# Patient Record
Sex: Female | Born: 1985 | Race: Black or African American | Hispanic: No | Marital: Single | Smoking: Current every day smoker
Health system: Southern US, Community
[De-identification: ages and names within clinical notes are randomized; demographics above are authoritative.]

---

## 2008-11-04 ENCOUNTER — Emergency Department (HOSPITAL_COMMUNITY): Admission: EM | Admit: 2008-11-04 | Discharge: 2008-11-04 | Payer: Self-pay | Admitting: Emergency Medicine

## 2017-06-10 ENCOUNTER — Encounter (HOSPITAL_BASED_OUTPATIENT_CLINIC_OR_DEPARTMENT_OTHER): Payer: Self-pay | Admitting: *Deleted

## 2017-06-10 ENCOUNTER — Emergency Department (HOSPITAL_BASED_OUTPATIENT_CLINIC_OR_DEPARTMENT_OTHER)
Admission: EM | Admit: 2017-06-10 | Discharge: 2017-06-10 | Disposition: A | Discharge status: Home / Self Care | Payer: Self-pay | Attending: Emergency Medicine | Admitting: Emergency Medicine

## 2017-06-10 ENCOUNTER — Emergency Department (HOSPITAL_BASED_OUTPATIENT_CLINIC_OR_DEPARTMENT_OTHER): Payer: Self-pay

## 2017-06-10 DIAGNOSIS — R519 Headache, unspecified: Secondary | ICD-10-CM

## 2017-06-10 DIAGNOSIS — F17200 Nicotine dependence, unspecified, uncomplicated: Secondary | ICD-10-CM | POA: Insufficient documentation

## 2017-06-10 DIAGNOSIS — R51 Headache: Secondary | ICD-10-CM | POA: Insufficient documentation

## 2017-06-10 LAB — CBC WITH DIFFERENTIAL/PLATELET
BASOS PCT: 0 %
Basophils Absolute: 0 10*3/uL (ref 0.0–0.1)
EOS ABS: 0.1 10*3/uL (ref 0.0–0.7)
Eosinophils Relative: 1 %
HCT: 36.2 % (ref 36.0–46.0)
HEMOGLOBIN: 11.6 g/dL — AB (ref 12.0–15.0)
LYMPHS ABS: 2.7 10*3/uL (ref 0.7–4.0)
Lymphocytes Relative: 36 %
MCH: 30.2 pg (ref 26.0–34.0)
MCHC: 32 g/dL (ref 30.0–36.0)
MCV: 94.3 fL (ref 78.0–100.0)
Monocytes Absolute: 0.5 10*3/uL (ref 0.1–1.0)
Monocytes Relative: 6 %
NEUTROS PCT: 57 %
Neutro Abs: 4.1 10*3/uL (ref 1.7–7.7)
Platelets: 212 10*3/uL (ref 150–400)
RBC: 3.84 MIL/uL — AB (ref 3.87–5.11)
RDW: 12.9 % (ref 11.5–15.5)
WBC: 7.4 10*3/uL (ref 4.0–10.5)

## 2017-06-10 LAB — COMPREHENSIVE METABOLIC PANEL
ALBUMIN: 3.2 g/dL — AB (ref 3.5–5.0)
ALK PHOS: 67 U/L (ref 38–126)
ALT: 12 U/L — AB (ref 14–54)
AST: 15 U/L (ref 15–41)
Anion gap: 5 (ref 5–15)
BUN: 12 mg/dL (ref 6–20)
CO2: 28 mmol/L (ref 22–32)
CREATININE: 0.86 mg/dL (ref 0.44–1.00)
Calcium: 8.6 mg/dL — ABNORMAL LOW (ref 8.9–10.3)
Chloride: 107 mmol/L (ref 101–111)
GFR calc Af Amer: >60 mL/min (ref >60)
GFR calc non Af Amer: >60 mL/min (ref >60)
GLUCOSE: 94 mg/dL (ref 65–99)
Potassium: 3.9 mmol/L (ref 3.5–5.1)
SODIUM: 140 mmol/L (ref 135–145)
Total Bilirubin: 0.4 mg/dL (ref 0.3–1.2)
Total Protein: 6.8 g/dL (ref 6.5–8.1)

## 2017-06-10 MED ORDER — DIPHENHYDRAMINE HCL 50 MG/ML IJ SOLN
12.5000 mg | Freq: Once | INTRAMUSCULAR | Status: AC
  Administered 2017-06-10: 12.5 mg via INTRAVENOUS
  Filled 2017-06-10: qty 1

## 2017-06-10 MED ORDER — METOCLOPRAMIDE HCL 5 MG/ML IJ SOLN
10.0000 mg | Freq: Once | INTRAMUSCULAR | Status: AC
  Administered 2017-06-10: 10 mg via INTRAVENOUS
  Filled 2017-06-10: qty 2

## 2017-06-10 NOTE — ED Notes (Signed)
Patient transported to CT 

## 2017-06-10 NOTE — Discharge Instructions (Signed)
Take tylenol, motrin for headaches.   Rest for 2 days.   Call [facility] regarding finding primary care doctor   Return to ER if you have worse headaches, blurry vision, vomiting, fevers, neck pain or stiffness

## 2017-06-10 NOTE — ED Triage Notes (Signed)
Headache x 4 days. States she took Ibuprofen for the pain this am.

## 2017-06-10 NOTE — ED Provider Notes (Signed)
[facility]   CSN: [id number] Arrival date & time: 06/10/17  1030     History   Chief Complaint Chief Complaint  Patient presents with  . Headache    HPI Rachael Tran is a 31 y.o. female otherwise healthy here presenting with headaches. Patient has been having intermittent headaches for the last 4 days. It is worse when she goes to work and she takes ibuprofen and it helps with the pain. It is some associated photophobia as well but denies any blurry vision or vomiting or weakness or numbness. Patient states that the headache is not sudden onset but just gradually getting worse. She states that she has no history of headaches or migraines. Of note, patient did get out of prison about 3 weeks ago and does not currently have a primary care doctor. Denies any family history or personal history of cerebral aneurysms. LMP a week ago, denies being pregnant.   The history is provided by the patient.    History reviewed. No pertinent past medical history.  There are no active problems to display for this patient.   History reviewed. No pertinent surgical history.  OB History    No data available       Home Medications    Prior to Admission medications   Not on File    Family History No family history on file.  Social History Social History  Substance Use Topics  . Smoking status: Current Every Day Smoker  . Smokeless tobacco: Never Used  . Alcohol use No     Allergies   Patient has no known allergies.   Review of Systems Review of Systems  Neurological: Positive for headaches.  All other systems reviewed and are negative.    Physical Exam Updated Vital Signs BP (!) 135/97   Pulse 71   Temp 98.3 F (36.8 C) (Oral)   Resp 20   Ht 5\' 5"  (1.651 m)   Wt 96.5 kg (212 lb 11.9 oz)   LMP 06/03/2017   SpO2 100%   BMI 35.40 kg/m   Physical Exam  Constitutional: She is oriented to person, place, and time.  Slightly  photophobic   HENT:  Head: Normocephalic.  Mouth/Throat: Oropharynx is clear and moist.  Eyes: Pupils are equal, round, and reactive to light. Conjunctivae and EOM are normal.  Neck: Normal range of motion. Neck supple.  Cardiovascular: Normal rate, regular rhythm and normal heart sounds.   Pulmonary/Chest: Effort normal and breath sounds normal. No respiratory distress. She has no wheezes.  Abdominal: Soft. Bowel sounds are normal. She exhibits no distension. There is no tenderness. There is no guarding.  Musculoskeletal: Normal range of motion.  Neurological: She is alert and oriented to person, place, and time.  CN 2-12 intact, nl strength throughout, nl finger to nose.   Skin: Skin is warm.  Psychiatric: She has a normal mood and affect.  Nursing note and vitals reviewed.    ED Treatments / Results  Labs (all labs ordered are listed, but only abnormal results are displayed) Labs Reviewed  CBC WITH DIFFERENTIAL/PLATELET - Abnormal; Notable for the following:       Result Value   RBC 3.84 (*)    Hemoglobin 11.6 (*)    All other components within normal limits  COMPREHENSIVE METABOLIC PANEL - Abnormal; Notable for the following:    Calcium 8.6 (*)    Albumin 3.2 (*)    ALT 12 (*)    All other components within normal  limits    EKG  EKG Interpretation None       Radiology Ct Head Wo Contrast  Result Date: 06/10/2017 CLINICAL DATA:  Right temporal region headache EXAM: CT HEAD WITHOUT CONTRAST TECHNIQUE: Contiguous axial images were obtained from the base of the skull through the vertex without intravenous contrast. COMPARISON:  March 30, 2014. FINDINGS: Brain: The ventricles are normal in size and configuration. There is no intracranial mass, hemorrhage, extra-axial fluid collection, or midline shift. Gray-white compartments are normal. No acute infarct evident. Vascular: No hyperdense vessel. There is no appreciable vascular calcification. Skull: The bony calvarium appears  intact. Sinuses/Orbits: Paranasal sinuses which are visualized are clear. Orbits appear symmetric bilaterally. Other: Mastoid air cells are clear. IMPRESSION: Study within normal limits. Electronically Signed   By: Bretta Bang III M.D.   On: 06/10/2017 11:30    Procedures Procedures (including critical care time)  Medications Ordered in ED Medications  metoCLOPramide (REGLAN) injection 10 mg (10 mg Intravenous Given 06/10/17 1139)  diphenhydrAMINE (BENADRYL) injection 12.5 mg (12.5 mg Intravenous Given 06/10/17 1140)     Initial Impression / Assessment and Plan / ED Course  I have reviewed the triage vital signs and the nursing notes.  Pertinent labs & imaging results that were available during my care of the patient were reviewed by me and considered in my medical decision making (see chart for details).     Rachael Tran is a 31 y.o. female here with headaches. Afebrile, no meningeal signs. Nl neuro exam. Likely migraines or tension headache. Given that she never had headaches like this, will get CT head. Low suspicion for [facility] so will not need LP if CT head unremarkable.   12:23 PM Labs and CT head unremarkable. Headaches improved. Will dc home. [facility] to [facility].    Final Clinical Impressions(s) / ED Diagnoses   Final diagnoses:  None    New Prescriptions New Prescriptions   No medications on file     Charlynne Pander, MD 06/10/17 1223

## 2018-12-04 IMAGING — CT CT HEAD W/O CM
3 series · 15 of 47 positions shown, 18 images · non-contrast
Comparison: March 30, 2014.

CLINICAL DATA: Right temporal region headache

EXAM:
CT HEAD WITHOUT CONTRAST
TECHNIQUE: Contiguous axial images were obtained from the base of the skull
through the vertex without intravenous contrast.

[Series 2: head wo · axial · 0.39mm/px · z∈[-135,-10]mm · 9 of 31 slices shown, 12 images]
[im 3/31  brain]
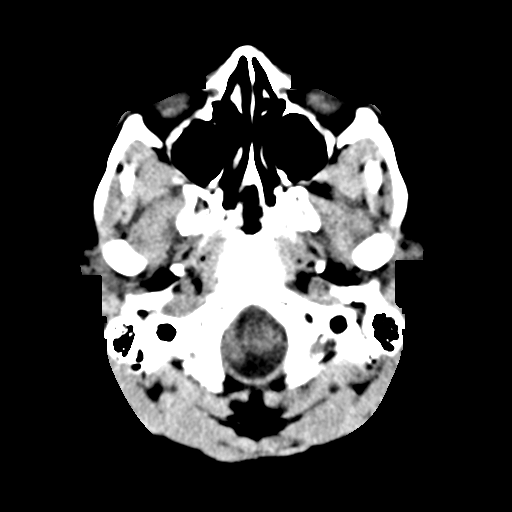
[im 3/31  bone]
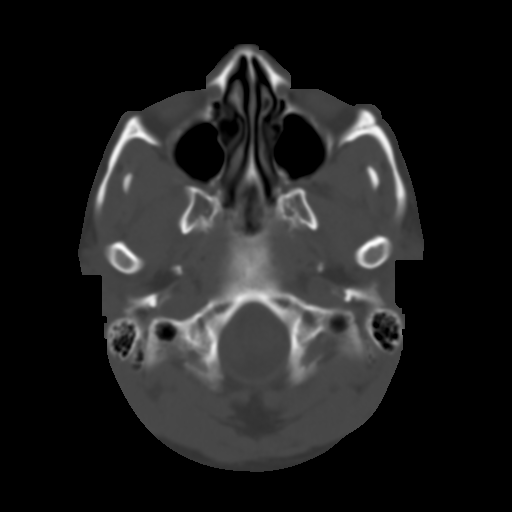
[im 6/31  brain]
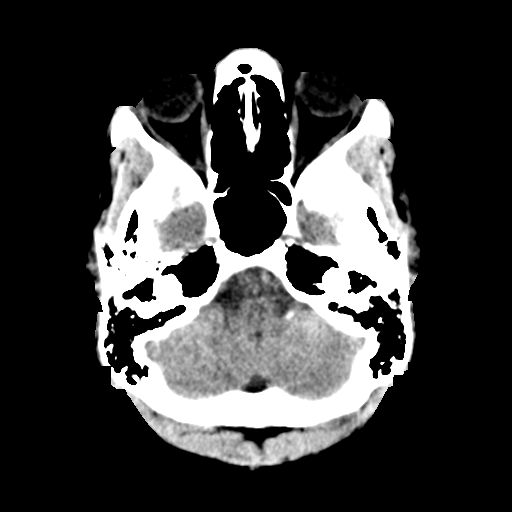
[im 9/31  brain]
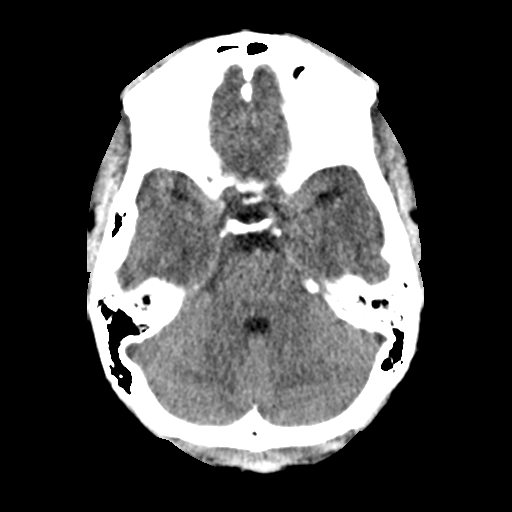
[im 12/31  brain]
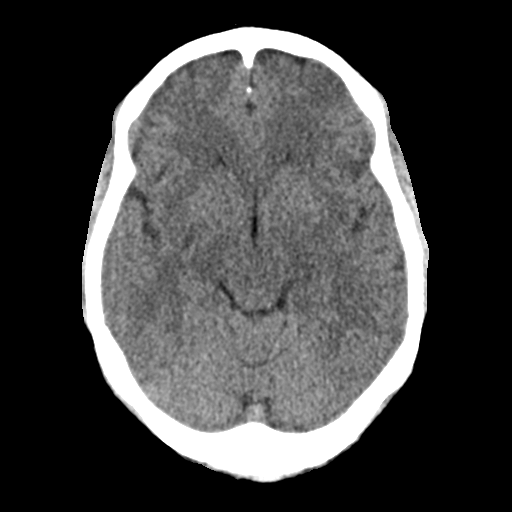
[im 16/31  brain]
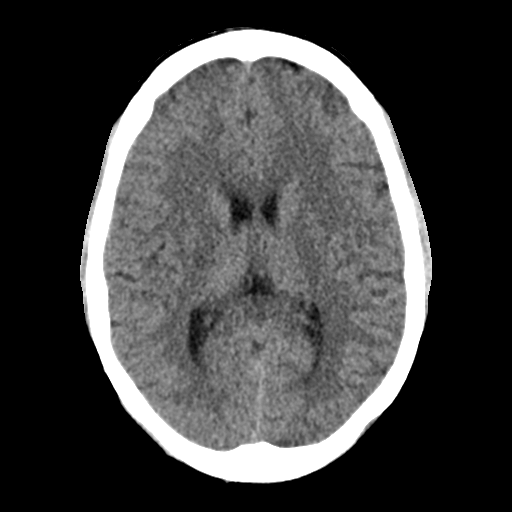
[im 16/31  bone]
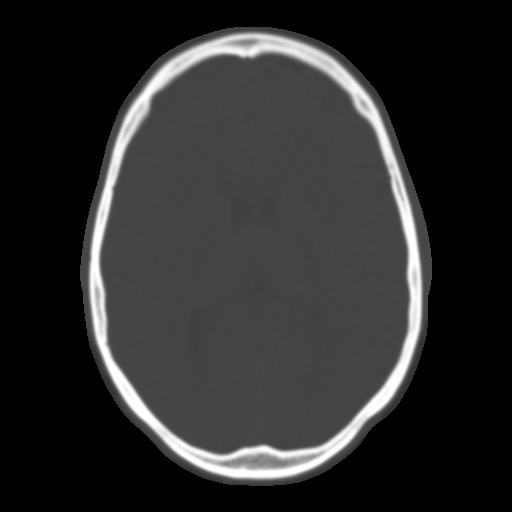
[im 19/31  brain]
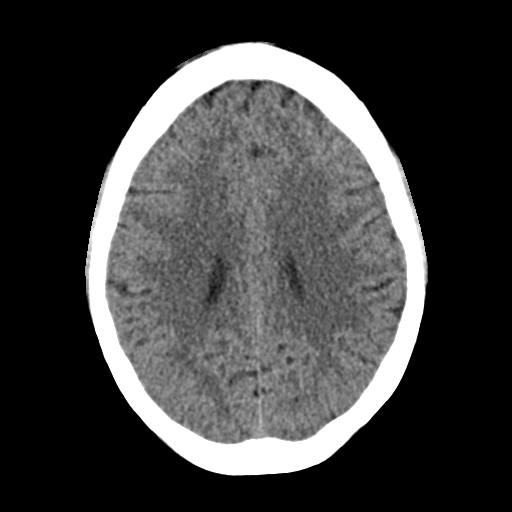
[im 22/31  brain]
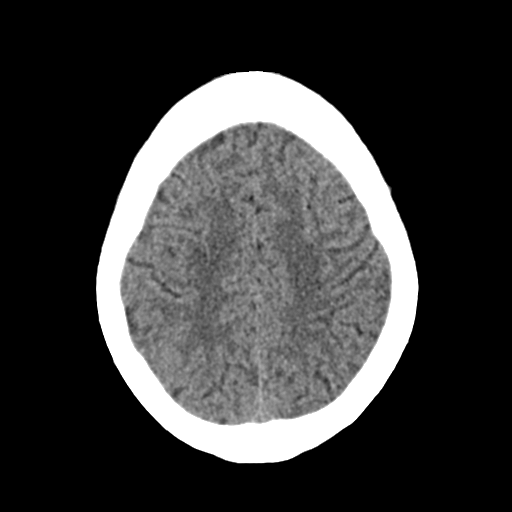
[im 25/31  brain]
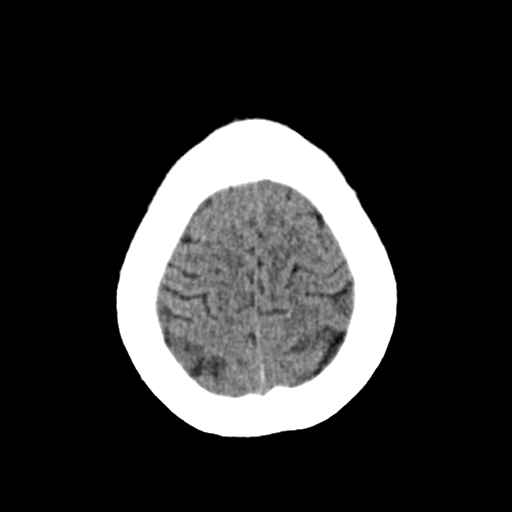
[im 28/31  brain]
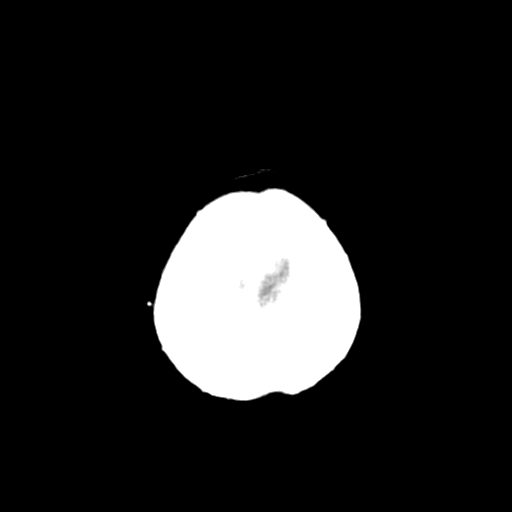
[im 28/31  bone]
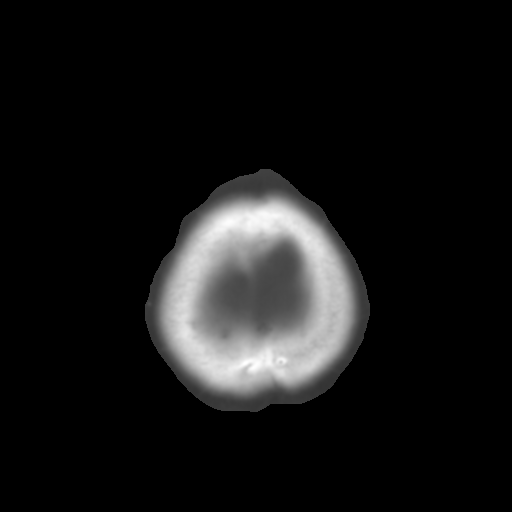

[Series 4: coronal soft · coronal · 0.31mm/px · 3 of 61 slices shown]
[im 21/61  brain]
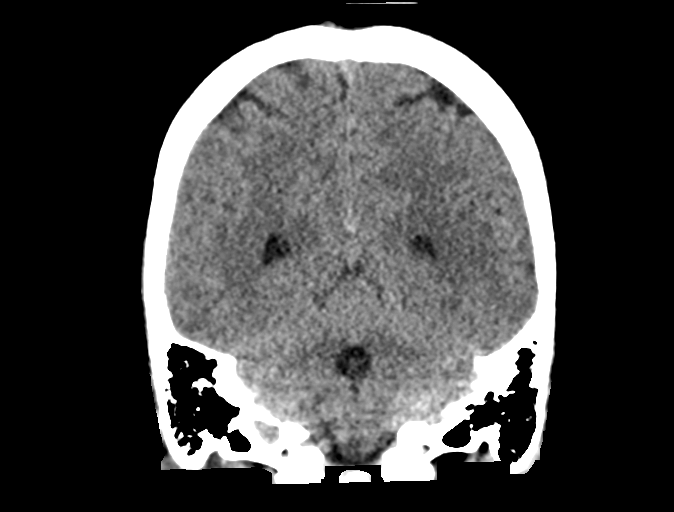
[im 27/61  brain]
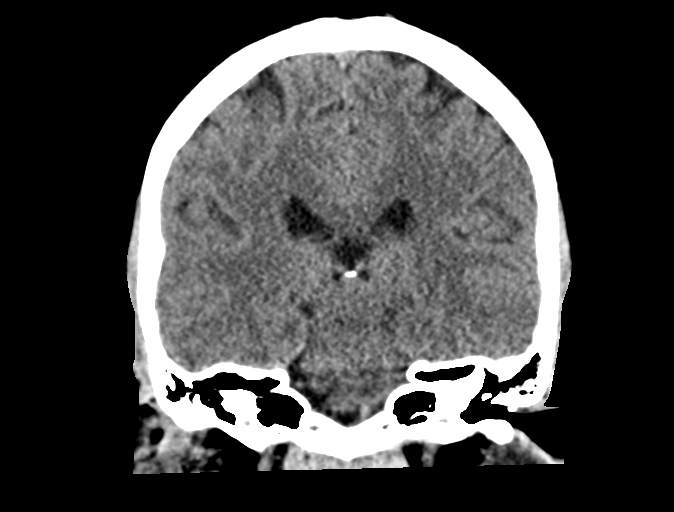
[im 34/61  brain]
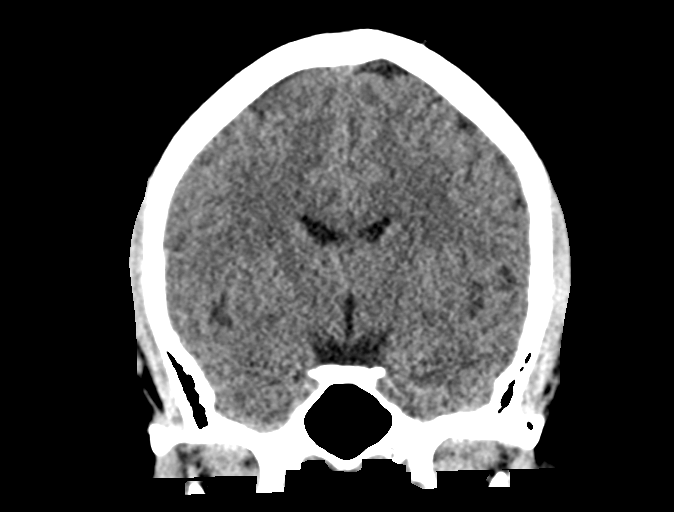

[Series 5: sag soft · sagittal · 0.31mm/px · 3 of 51 slices shown]
[im 17/51  brain]
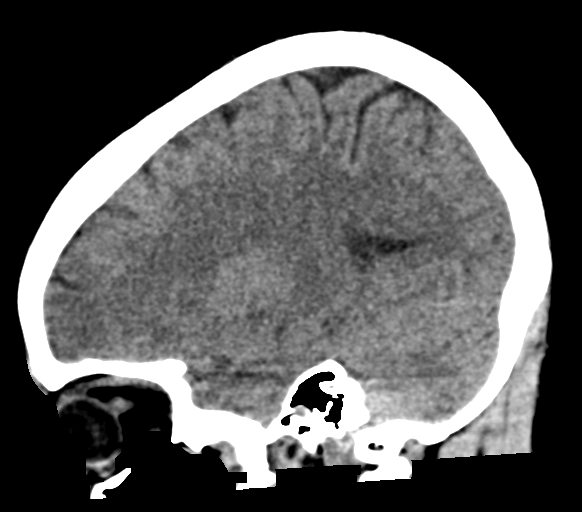
[im 26/51  brain]
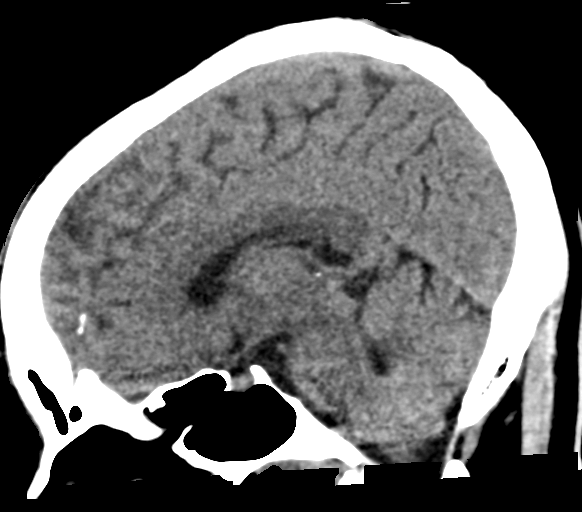
[im 34/51  brain]
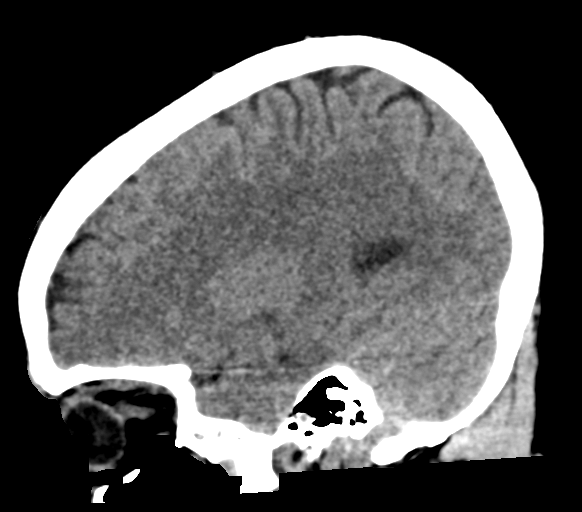

[15 of 47 positions shown; findings below may reference images not displayed]

FINDINGS: Brain: The ventricles are normal in size and configuration. There is
no intracranial mass, hemorrhage, extra-axial fluid collection, or
midline shift. Gray-white compartments are normal. No acute infarct
evident.

Vascular: No hyperdense vessel. There is no appreciable vascular
calcification.

Skull: The bony calvarium appears intact.

Sinuses/Orbits: Paranasal sinuses which are visualized are clear.
Orbits appear symmetric bilaterally.

Other: Mastoid air cells are clear.
IMPRESSION: Study within normal limits.
